# Patient Record
Sex: Male | Born: 1970 | Race: White | Hispanic: No
Health system: Southern US, Community
[De-identification: ages and names within clinical notes are randomized; demographics above are authoritative.]

---

## 2020-05-27 ENCOUNTER — Emergency Department
Admission: EM | Admit: 2020-05-27 | Discharge: 2020-05-27 | Payer: Self-pay | Attending: Student in an Organized Health Care Education/Training Program | Admitting: Student in an Organized Health Care Education/Training Program

## 2020-05-27 ENCOUNTER — Emergency Department: Payer: Self-pay

## 2020-05-27 ENCOUNTER — Encounter: Payer: Self-pay | Admitting: Radiology

## 2020-05-27 DIAGNOSIS — F419 Anxiety disorder, unspecified: Secondary | ICD-10-CM | POA: Insufficient documentation

## 2020-05-27 DIAGNOSIS — Z20822 Contact with and (suspected) exposure to covid-19: Secondary | ICD-10-CM | POA: Insufficient documentation

## 2020-05-27 DIAGNOSIS — R0602 Shortness of breath: Secondary | ICD-10-CM | POA: Insufficient documentation

## 2020-05-27 DIAGNOSIS — F410 Panic disorder [episodic paroxysmal anxiety] without agoraphobia: Secondary | ICD-10-CM | POA: Insufficient documentation

## 2020-05-27 DIAGNOSIS — R079 Chest pain, unspecified: Secondary | ICD-10-CM

## 2020-05-27 LAB — CBC WITH DIFFERENTIAL/PLATELET
Abs Immature Granulocytes: 0.02 10*3/uL (ref 0.00–0.07)
Basophils Absolute: 0 10*3/uL (ref 0.0–0.1)
Basophils Relative: 0 %
Eosinophils Absolute: 0.1 10*3/uL (ref 0.0–0.5)
Eosinophils Relative: 1 %
HCT: 41.3 % (ref 39.0–52.0)
Hemoglobin: 14.5 g/dL (ref 13.0–17.0)
Immature Granulocytes: 0 %
Lymphocytes Relative: 28 %
Lymphs Abs: 2.2 10*3/uL (ref 0.7–4.0)
MCH: 31.8 pg (ref 26.0–34.0)
MCHC: 35.1 g/dL (ref 30.0–36.0)
MCV: 90.6 fL (ref 80.0–100.0)
Monocytes Absolute: 0.5 10*3/uL (ref 0.1–1.0)
Monocytes Relative: 6 %
Neutro Abs: 5 10*3/uL (ref 1.7–7.7)
Neutrophils Relative %: 65 %
Platelets: 348 10*3/uL (ref 150–400)
RBC: 4.56 MIL/uL (ref 4.22–5.81)
RDW: 12.3 % (ref 11.5–15.5)
WBC: 7.8 10*3/uL (ref 4.0–10.5)
nRBC: 0 % (ref 0.0–0.2)

## 2020-05-27 LAB — BASIC METABOLIC PANEL
Anion gap: 10 (ref 5–15)
BUN: 18 mg/dL (ref 6–20)
CO2: 23 mmol/L (ref 22–32)
Calcium: 9.6 mg/dL (ref 8.9–10.3)
Chloride: 109 mmol/L (ref 98–111)
Creatinine, Ser: 1.2 mg/dL (ref 0.61–1.24)
GFR, Estimated: >60 mL/min (ref >60)
Glucose, Bld: 89 mg/dL (ref 70–99)
Potassium: 4.4 mmol/L (ref 3.5–5.1)
Sodium: 142 mmol/L (ref 135–145)

## 2020-05-27 LAB — URINE DRUG SCREEN, QUALITATIVE (ARMC ONLY)
Amphetamines, Ur Screen: POSITIVE — AB
Barbiturates, Ur Screen: NOT DETECTED
Benzodiazepine, Ur Scrn: NOT DETECTED
Cannabinoid 50 Ng, Ur ~~LOC~~: POSITIVE — AB
Cocaine Metabolite,Ur ~~LOC~~: NOT DETECTED
MDMA (Ecstasy)Ur Screen: NOT DETECTED
Methadone Scn, Ur: NOT DETECTED
Opiate, Ur Screen: NOT DETECTED
Phencyclidine (PCP) Ur S: NOT DETECTED
Tricyclic, Ur Screen: NOT DETECTED

## 2020-05-27 LAB — RESPIRATORY PANEL BY RT PCR (FLU A&B, COVID)
Influenza A by PCR: NEGATIVE
Influenza B by PCR: NEGATIVE
SARS Coronavirus 2 by RT PCR: NEGATIVE

## 2020-05-27 LAB — TROPONIN I (HIGH SENSITIVITY)
Troponin I (High Sensitivity): 3 ng/L (ref <18)
Troponin I (High Sensitivity): 3 ng/L (ref <18)

## 2020-05-27 MED ORDER — LORAZEPAM 2 MG/ML IJ SOLN
INTRAMUSCULAR | Status: AC
  Administered 2020-05-27: 1 mg via INTRAVENOUS
  Filled 2020-05-27: qty 1

## 2020-05-27 MED ORDER — ALBUTEROL SULFATE (2.5 MG/3ML) 0.083% IN NEBU
2.5000 mg | INHALATION_SOLUTION | Freq: Once | RESPIRATORY_TRACT | Status: AC
  Administered 2020-05-27: 2.5 mg via RESPIRATORY_TRACT

## 2020-05-27 MED ORDER — ALBUTEROL SULFATE (2.5 MG/3ML) 0.083% IN NEBU
INHALATION_SOLUTION | RESPIRATORY_TRACT | Status: AC
  Filled 2020-05-27: qty 3

## 2020-05-27 MED ORDER — IOHEXOL 350 MG/ML SOLN
75.0000 mL | Freq: Once | INTRAVENOUS | Status: AC | PRN
  Administered 2020-05-27: 75 mL via INTRAVENOUS

## 2020-05-27 MED ORDER — LORAZEPAM 2 MG/ML IJ SOLN: 1.0000 mg | Freq: Once | INTRAMUSCULAR | Status: AC

## 2020-05-27 NOTE — ED Provider Notes (Signed)
Patient says he may have been exposed to Covid in a house where he was staying.  We will check him   Dylan Natal, MD 05/27/20 [phone]

## 2020-05-27 NOTE — ED Provider Notes (Signed)
[facility] Emergency Department Provider Note    First MD Initiated Contact with Patient 05/27/20 1449     (approximate)  I have reviewed the triage vital signs and the nursing notes.   HISTORY  Chief Complaint Shortness of Breath (pt was pulled over and c/o SOB/anxiety and brought by PD for clearance)    HPI Dylan Rice is a 49 y.o. male presents to the ER in police custody after having fainting spell after he had been detained.  Does have a history of substance abuse but denies any recent illicit drug use.  Does feel short of breath.  States he does have a history of asthma.  This started when he was being taken to jail.  Is having some chest discomfort as well.  Feels anxious and like he is having a panic attack.    No past medical history on file. No family history on file.  There are no problems to display for this patient.     Prior to Admission medications   Not on File    Allergies Patient has no allergy information on record.    Social History Social History   Tobacco Use  . Smoking status: Not on file  Substance Use Topics  . Alcohol use: Not on file  . Drug use: Not on file    Review of Systems Patient denies headaches, rhinorrhea, blurry vision, numbness, shortness of breath, chest pain, edema, cough, abdominal pain, nausea, vomiting, diarrhea, dysuria, fevers, rashes or hallucinations unless otherwise stated above in HPI. ____________________________________________   PHYSICAL EXAM:  VITAL SIGNS: Vitals:   05/27/20 1450  BP: (!) 158/111  Pulse: (!) 112  Resp: (!) 24  Temp: 98.4 F (36.9 C)  SpO2: 98%    Constitutional: Alert and oriented, anxious, gasping for air but speaking in complete sentences  Eyes: Conjunctivae are normal.  Head: Atraumatic. Nose: No congestion/rhinnorhea. Mouth/Throat: Mucous membranes are moist.   Neck: No stridor. Painless ROM.  Cardiovascular: tachycardic rate, regular rhythm.  Grossly normal heart sounds.  Good peripheral circulation. Respiratory: hyperventilation but able to speak in complete sentences Gastrointestinal: Soft and nontender. No distention. No abdominal bruits. No CVA tenderness. Genitourinary: deferred Musculoskeletal: No lower extremity tenderness nor edema.  No joint effusions. Neurologic:  Normal speech and language. No gross focal neurologic deficits are appreciated. No facial droop Skin:  Skin is warm, dry and intact. No rash noted. Psychiatric: anxious ____________________________________________   LABS (all labs ordered are listed, but only abnormal results are displayed)  No results found for this or any previous visit (from the past 24 hour(s)). ____________________________________________  EKG My review and personal interpretation at Time: 15:07   Indication: sob  Rate: 115  Rhythm: sinus Axis: normal Other: normal intervals, no stemi ____________________________________________  RADIOLOGY  I personally reviewed all radiographic images ordered to evaluate for the above acute complaints and reviewed radiology reports and findings.  These findings were personally discussed with the patient.  Please see medical record for radiology report.  ____________________________________________   PROCEDURES  Procedure(s) performed:  Procedures    Critical Care performed: no ____________________________________________   INITIAL IMPRESSION / ASSESSMENT AND PLAN / ED COURSE  Pertinent labs & imaging results that were available during my care of the patient were reviewed by me and considered in my medical decision making (see chart for details).   DDX: Panic attack, medication effect, substance abuse, asthma, pneumothorax, PE, pneumonia, CHF, ACS  Benny Deutschman is a 49 y.o. who presents to the  ED with presentation as described above.  Patient very anxious appearing and I suspect this is a panic attack given the history does have some  risk factors is tachypneic and tachycardic therefore blood will be sent for the but differential.  Will give albuterol inhaler as the patient does have a history of smoking and reported history of asthma though I do not appreciate much significant wheezing.  We will also order CT imaging of the chest to rule out PE.  Patient will be signed out to oncoming physician follow-up on blood work, imaging and reassess     The patient was evaluated in Emergency Department today for the symptoms described in the history of present illness. He/she was evaluated in the context of the global COVID-19 pandemic, which necessitated consideration that the patient might be at risk for infection with the SARS-CoV-2 virus that causes COVID-19. Institutional protocols and algorithms that pertain to the evaluation of patients at risk for COVID-19 are in a state of rapid change based on information released by regulatory bodies including the CDC and federal and state organizations. These policies and algorithms were followed during the patient's care in the ED.  As part of my medical decision making, I reviewed the following data within the electronic MEDICAL RECORD NUMBER Nursing notes reviewed and incorporated, Labs reviewed, notes from prior ED visits and [place] Controlled Substance Database   ____________________________________________   FINAL CLINICAL IMPRESSION(S) / ED DIAGNOSES  Final diagnoses:  Chest pain, unspecified type      NEW MEDICATIONS STARTED DURING THIS VISIT:  New Prescriptions   No medications on file     Note:  This document was prepared using Dragon voice recognition software and may include unintentional dictation errors.    Willy Eddy, MD 05/27/20 [phone]

## 2020-05-27 NOTE — ED Notes (Signed)
Patient transported to CT with officer at this time.

## 2020-05-27 NOTE — ED Notes (Signed)
Pt states to staff "I have been around a lot of COVID in my house I am staying in, think it is that?". Informed provider at this time.

## 2020-05-27 NOTE — Discharge Instructions (Addendum)
Your Covid and influenza test today was negative

## 2020-05-27 NOTE — ED Provider Notes (Signed)
Chest x-ray and CT angio of the chest return showing no acute disease a CT shows one small but not set.  Second troponin is negative.  We are currently awaiting results of the coronavirus test.  ----------------------------------------- 5:59 PM on 05/27/2020 -----------------------------------------  Covid test is now negative patient's labs have been okay CT was negative I will discharge him.  His blood pressure slightly high but that could be due to his drug use and if at resolves that would be great if not they can [facility] it [facility].   Arnaldo Natal, MD 05/27/20 1800

## 2021-11-12 IMAGING — CT CT ANGIO CHEST
2 of 6 series · 19 of 46 positions shown · IV contrast (APPLIED)
Comparison: Chest radiograph May 27, 2020

CLINICAL DATA: Shortness of breath

EXAM:
CT ANGIOGRAPHY CHEST WITH CONTRAST
TECHNIQUE: Multidetector CT imaging of the chest was performed using the
standard protocol during bolus administration of intravenous
contrast. Multiplanar CT image reconstructions and MIPs were
obtained to evaluate the vascular anatomy.
CONTRAST:  75mL OMNIPAQUE IOHEXOL 350 MG/ML SOLN

[Series 5: thins · axial · 0.72mm/px · z∈[-317,-15]mm · 16 of 332 slices shown]
[im 15/332  lung]
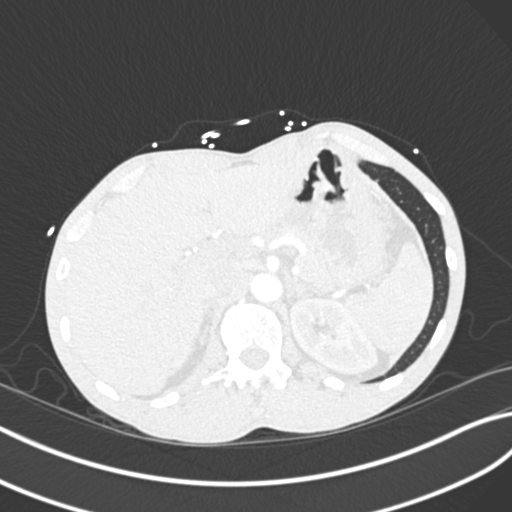
[im 44/332  soft-tissue]
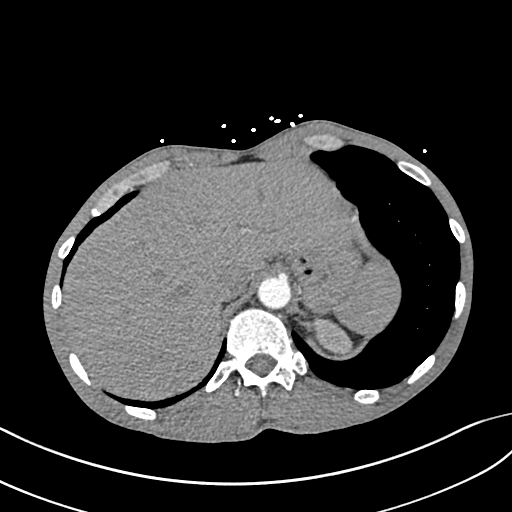
[im 58/332  lung]
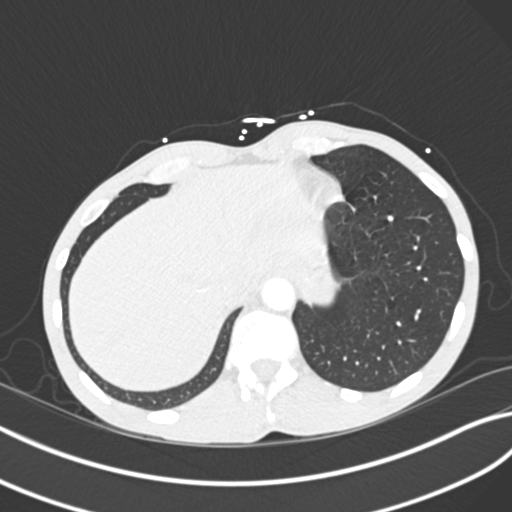
[im 72/332  soft-tissue]
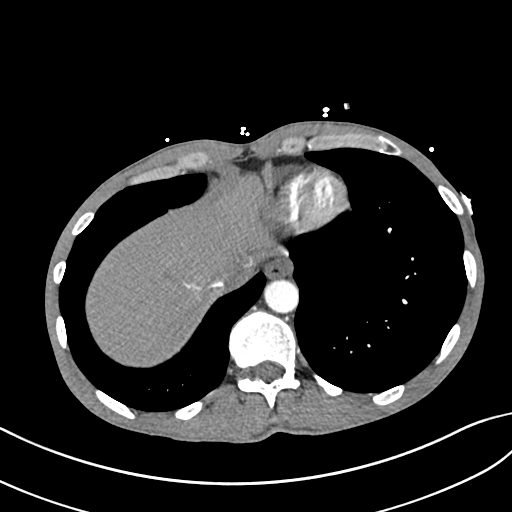
[im 101/332  lung]
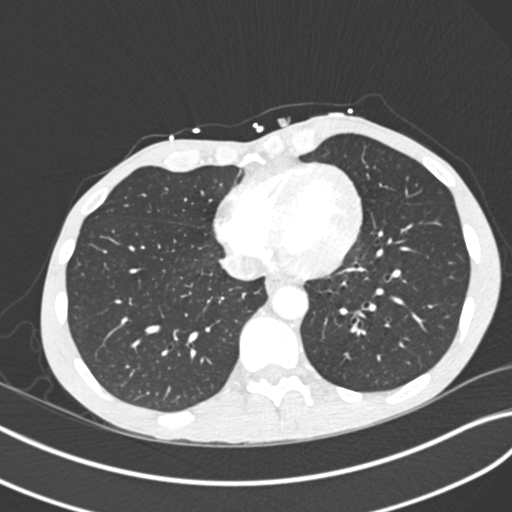
[im 116/332  soft-tissue]
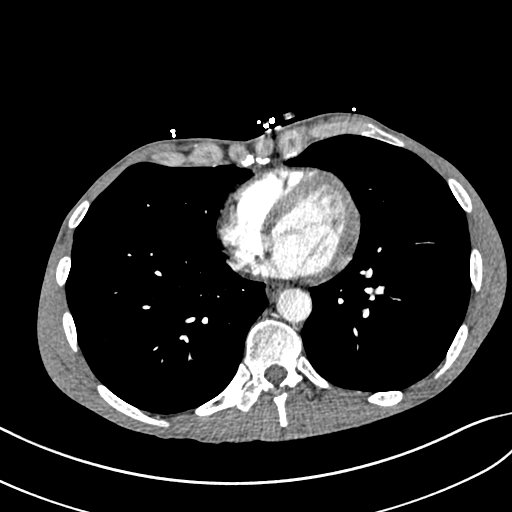
[im 130/332  lung]
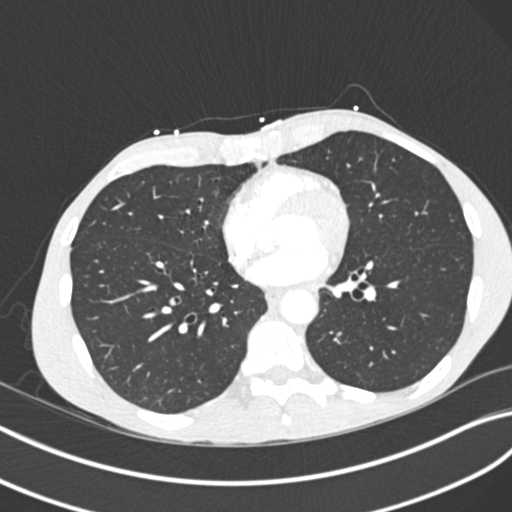
[im 159/332  soft-tissue]
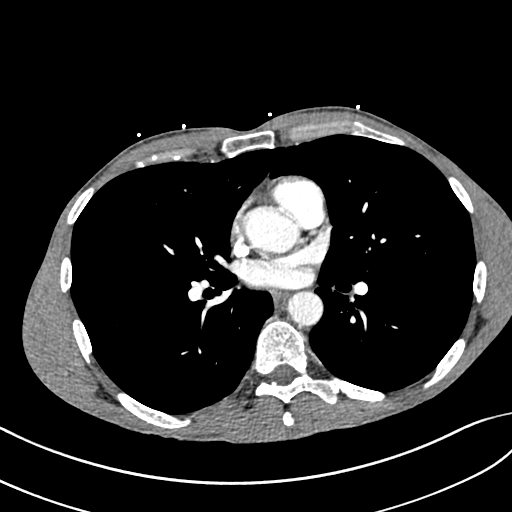
[im 173/332  lung]
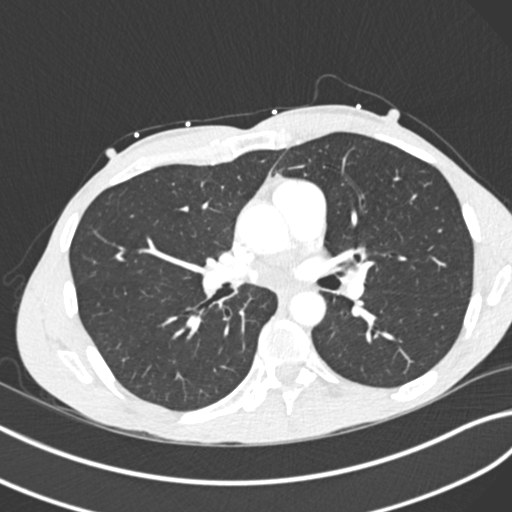
[im 202/332  soft-tissue]
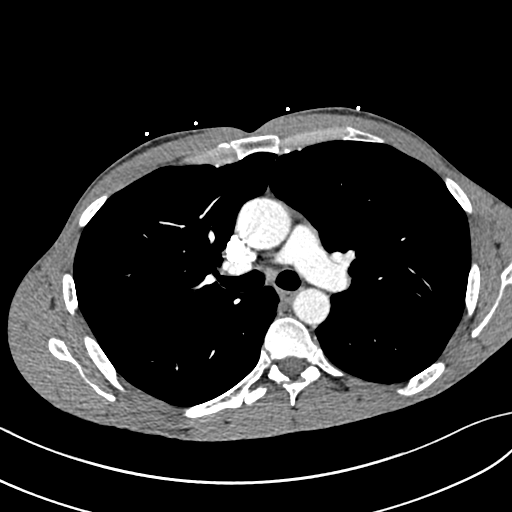
[im 216/332  lung]
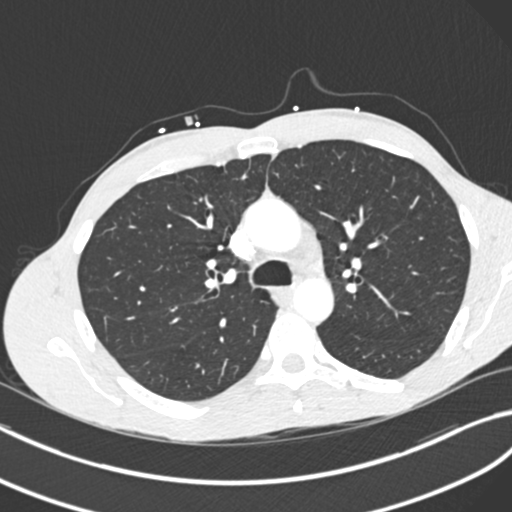
[im 231/332  soft-tissue]
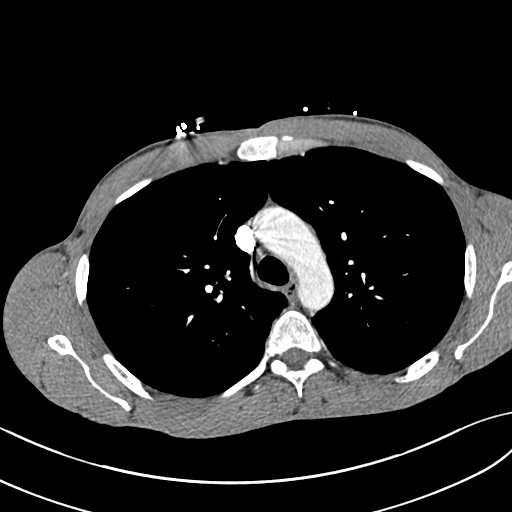
[im 260/332  lung]
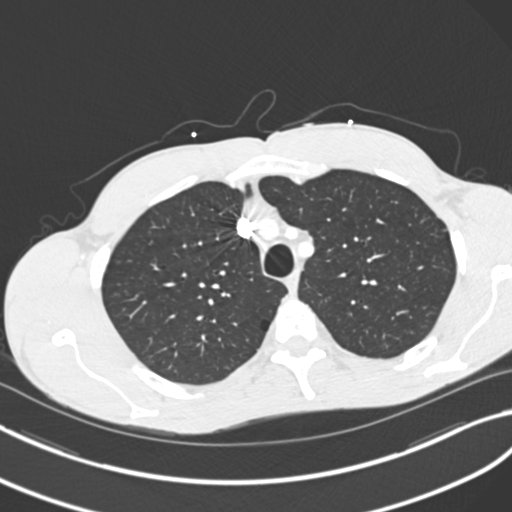
[im 274/332  soft-tissue]
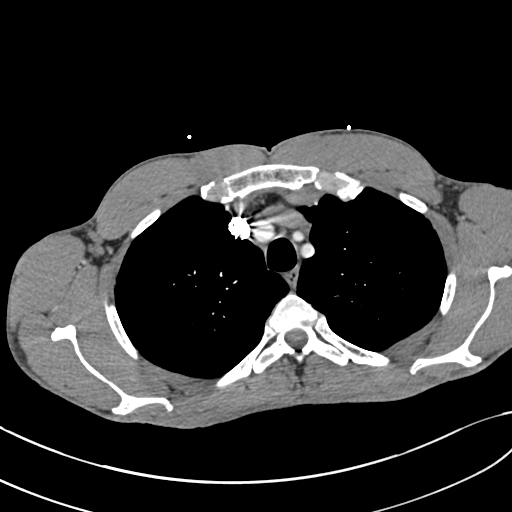
[im 288/332  lung]
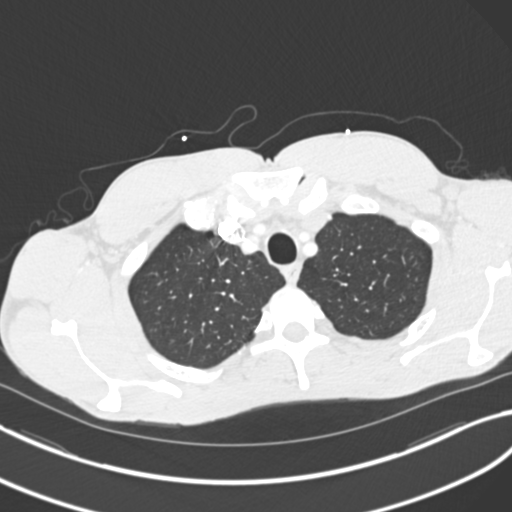
[im 317/332  soft-tissue]
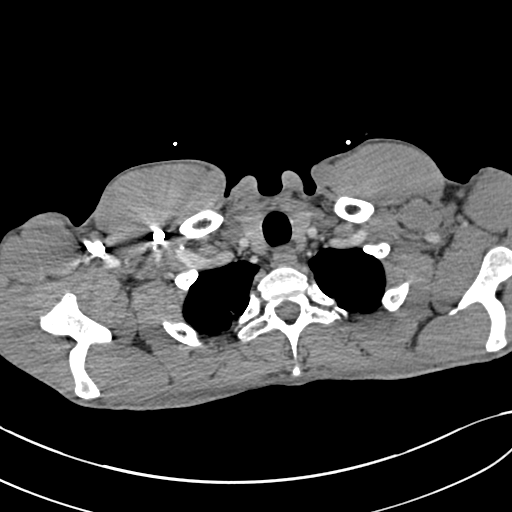

[Series 7: coronal mpr · coronal · 0.68mm/px · 3 of 82 slices shown]
[im 21/82  soft-tissue]
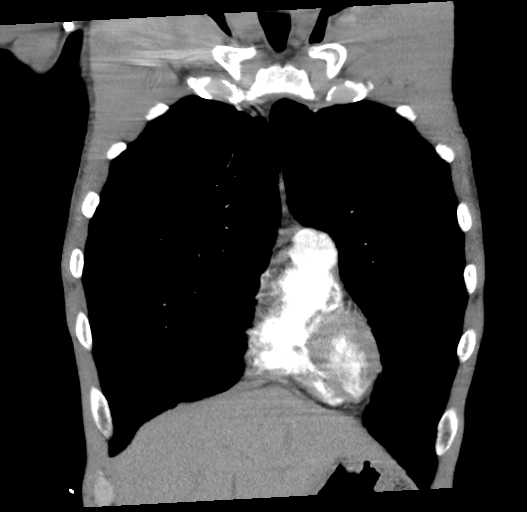
[im 41/82  soft-tissue]
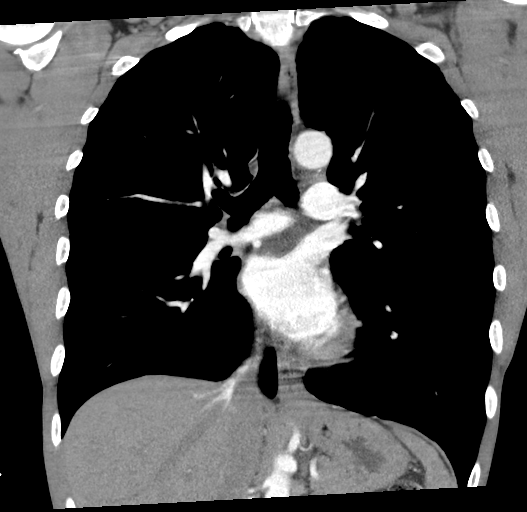
[im 61/82  soft-tissue]
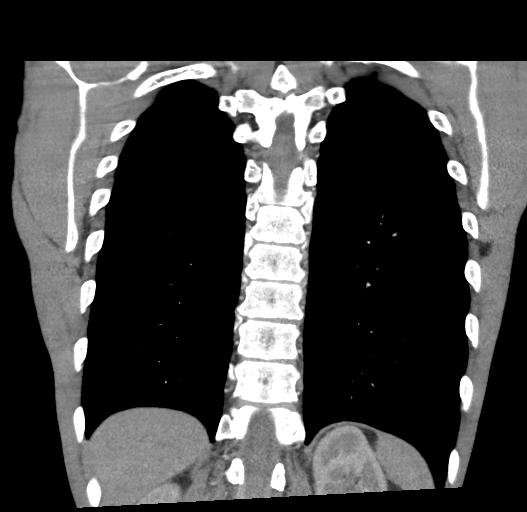

[19 of 46 positions shown; findings below may reference images not displayed]

FINDINGS: Cardiovascular: There is no demonstrable pulmonary embolus. There is
no thoracic aortic aneurysm or dissection. The visualized great
vessels appear normal. No pericardial effusion or pericardial
thickening evident.

Mediastinum/Nodes: Visualized thyroid appears normal. No evident
thoracic adenopathy. No esophageal lesions are evident.

Lungs/Pleura: No evident pneumothorax. There is a focal bulla in the
left lower lobe measuring 2.1 x 1.6 cm. There is mild scarring in
the apices. There is no edema or airspace opacity. No pleural
effusions. Trachea and major bronchial structures appear patent.

Upper Abdomen: Visualized upper abdominal structures appear
unremarkable.

Musculoskeletal: No evident blastic or lytic bone lesions. No chest
wall lesions.

Review of the MIP images confirms the above findings.
IMPRESSION: 1. No demonstrable pulmonary embolus. No thoracic aortic aneurysm or
dissection.

2. No edema or airspace opacity. Small bulla noted in left lower
lobe. No pleural effusions. No evident pneumothorax.

3.  No evident adenopathy.
